# Patient Record
Sex: Male | Born: 1975 | Race: White | Hispanic: No | Marital: Married | Smoking: Current every day smoker
Health system: Southern US, Community
[De-identification: ages and names within clinical notes are randomized; demographics above are authoritative.]

## PROBLEM LIST (undated history)

## (undated) DIAGNOSIS — M797 Fibromyalgia: Secondary | ICD-10-CM

## (undated) DIAGNOSIS — G8929 Other chronic pain: Secondary | ICD-10-CM

## (undated) DIAGNOSIS — M5430 Sciatica, unspecified side: Secondary | ICD-10-CM

## (undated) DIAGNOSIS — M549 Dorsalgia, unspecified: Secondary | ICD-10-CM

## (undated) DIAGNOSIS — M329 Systemic lupus erythematosus, unspecified: Secondary | ICD-10-CM

## (undated) DIAGNOSIS — M069 Rheumatoid arthritis, unspecified: Secondary | ICD-10-CM

## (undated) DIAGNOSIS — IMO0002 Reserved for concepts with insufficient information to code with codable children: Secondary | ICD-10-CM

## (undated) DIAGNOSIS — M533 Sacrococcygeal disorders, not elsewhere classified: Secondary | ICD-10-CM

---

## 2014-01-21 ENCOUNTER — Encounter (HOSPITAL_COMMUNITY): Payer: Self-pay | Admitting: Emergency Medicine

## 2014-01-21 ENCOUNTER — Emergency Department (HOSPITAL_COMMUNITY)
Admission: EM | Admit: 2014-01-21 | Discharge: 2014-01-21 | Disposition: A | Discharge status: Home / Self Care | Payer: Medicare Other | Attending: Emergency Medicine | Admitting: Emergency Medicine

## 2014-01-21 DIAGNOSIS — Z8739 Personal history of other diseases of the musculoskeletal system and connective tissue: Secondary | ICD-10-CM | POA: Insufficient documentation

## 2014-01-21 DIAGNOSIS — M545 Low back pain, unspecified: Secondary | ICD-10-CM

## 2014-01-21 DIAGNOSIS — M329 Systemic lupus erythematosus, unspecified: Secondary | ICD-10-CM | POA: Insufficient documentation

## 2014-01-21 DIAGNOSIS — G8929 Other chronic pain: Secondary | ICD-10-CM

## 2014-01-21 DIAGNOSIS — Z880 Allergy status to penicillin: Secondary | ICD-10-CM | POA: Insufficient documentation

## 2014-01-21 DIAGNOSIS — Y929 Unspecified place or not applicable: Secondary | ICD-10-CM | POA: Insufficient documentation

## 2014-01-21 DIAGNOSIS — R52 Pain, unspecified: Secondary | ICD-10-CM | POA: Insufficient documentation

## 2014-01-21 DIAGNOSIS — Y9389 Activity, other specified: Secondary | ICD-10-CM | POA: Insufficient documentation

## 2014-01-21 DIAGNOSIS — F17200 Nicotine dependence, unspecified, uncomplicated: Secondary | ICD-10-CM | POA: Insufficient documentation

## 2014-01-21 DIAGNOSIS — X503XXA Overexertion from repetitive movements, initial encounter: Secondary | ICD-10-CM | POA: Insufficient documentation

## 2014-01-21 HISTORY — DX: Dorsalgia, unspecified: M54.9

## 2014-01-21 HISTORY — DX: Rheumatoid arthritis, unspecified: M06.9

## 2014-01-21 HISTORY — DX: Systemic lupus erythematosus, unspecified: M32.9

## 2014-01-21 HISTORY — DX: Sciatica, unspecified side: M54.30

## 2014-01-21 HISTORY — DX: Sacrococcygeal disorders, not elsewhere classified: M53.3

## 2014-01-21 HISTORY — DX: Other chronic pain: G89.29

## 2014-01-21 HISTORY — DX: Reserved for concepts with insufficient information to code with codable children: IMO0002

## 2014-01-21 HISTORY — DX: Fibromyalgia: M79.7

## 2014-01-21 MED ORDER — OXYCODONE-ACETAMINOPHEN 5-325 MG PO TABS
1.0000 | ORAL_TABLET | Freq: Once | ORAL | Status: AC
Start: 2014-01-21 — End: 2014-01-21
  Administered 2014-01-21: 1 via ORAL
  Filled 2014-01-21: qty 1

## 2014-01-21 MED ORDER — DIAZEPAM 5 MG PO TABS
5.0000 mg | ORAL_TABLET | Freq: Once | ORAL | Status: AC
  Administered 2014-01-21: 5 mg via ORAL
  Filled 2014-01-21: qty 1

## 2014-01-21 MED ORDER — DIAZEPAM 5 MG PO TABS: 5.0000 mg | ORAL_TABLET | Freq: Four times a day (QID) | ORAL | Status: AC | PRN

## 2014-01-21 MED ORDER — PREDNISONE 20 MG PO TABS: 40.0000 mg | ORAL_TABLET | Freq: Every day | ORAL | Status: AC

## 2014-01-21 MED ORDER — OXYCODONE-ACETAMINOPHEN 5-325 MG PO TABS: ORAL_TABLET | ORAL | Status: AC

## 2014-01-21 MED ORDER — KETOROLAC TROMETHAMINE 60 MG/2ML IM SOLN
30.0000 mg | Freq: Once | INTRAMUSCULAR | Status: AC
  Administered 2014-01-21: 30 mg via INTRAMUSCULAR
  Filled 2014-01-21: qty 2

## 2014-01-21 NOTE — ED Notes (Signed)
Patient states he has been moving, has lifting heavy boxes.  Patient states he does have chronic back pain.  Patient states that he needs a referral for PCP.

## 2014-01-21 NOTE — ED Notes (Signed)
Pt states that he moved here 3 months ago, states he has not seen a PCP. States he also used to see a pain management doctor but has not established one here yet. States he used to take neurontin 300 mg tid, zanifelx 2mg  tid, norco 10mg  4xday. States he has been out of meds x1 month.

## 2014-01-21 NOTE — Discharge Instructions (Signed)
Please take ibuprofen 400mg  (this is normally 2 over the counter pills) every 6 hours (take with food to minimze stomach irritation).   Take valium and/or percocet for breakthrough pain, do not drink alcohol, drive, care for children or perfom other critical tasks while taking valium and/or percocet.  Please follow with your primary care doctor in the next 2 days for a check-up. They must obtain records for further management.   Do not hesitate to return to the Emergency Department for any new, worsening or concerning symptoms.    Chronic Back Pain  When back pain lasts longer than 3 months, it is called chronic back pain.People with chronic back pain often go through certain periods that are more intense (flare-ups).  CAUSES Chronic back pain can be caused by wear and tear (degeneration) on different structures in your back. These structures include:  The bones of your spine (vertebrae) and the joints surrounding your spinal cord and nerve roots (facets).  The strong, fibrous tissues that connect your vertebrae (ligaments). Degeneration of these structures may result in pressure on your nerves. This can lead to constant pain. HOME CARE INSTRUCTIONS  Avoid bending, heavy lifting, prolonged sitting, and activities which make the problem worse.  Take brief periods of rest throughout the day to reduce your pain. Lying down or standing usually is better than sitting while you are resting.  Take over-the-counter or prescription medicines only as directed by your caregiver. SEEK IMMEDIATE MEDICAL CARE IF:   You have weakness or numbness in one of your legs or feet.  You have trouble controlling your bladder or bowels.  You have nausea, vomiting, abdominal pain, shortness of breath, or fainting. Document Released: 10/12/2004 Document Revised: 11/27/2011 Document Reviewed: 08/19/2011 [facility] Patient Information 2014 Webster, Jason.

## 2014-01-21 NOTE — ED Provider Notes (Signed)
CSN: [id number]     Arrival date & time 01/21/14  1911 History  This chart was scribed for non-physician practitioner Wynetta Emery, PA-C working with Merrie Roof, * by Valera Castle, ED scribe. This patient was seen in room TR05C/TR05C and the patient's care was started at 8:23 PM.   Chief Complaint  Patient presents with  . Back Pain   (Consider location/radiation/quality/duration/timing/severity/associated sxs/prior Treatment) The history is provided by the patient. No language interpreter was used.   HPI Comments: Jason Webster is a 38 y.o. male with h/o chronic back pain who presents to the Emergency Department complaining of constant, lower back pain, onset after lifting heavy boxes several days ago. He denies obvious injuries at that time. He reports his back pain intermittently radiates down his bilateral LE, worse on the left. He states this pain is different from his h/lo chronic back pain. He states he lifts his kids up a lot and needs to get his problem fixed so he can take care of his children. He states he has been taking Tylenol for his pain without relief. He denies any other symptoms. He reports allergies to Penicillin and Diclofenac. He reports h/o Lupus. He denies h/o cancer, IV drug use.   PCP - No primary provider on file.  Past Medical History  Diagnosis Date  . Chronic back pain   . Sciatica   . RA (rheumatoid arthritis)   . Tail bone pain     inverted  . Fibromyalgia   . Lupus     in remission   History reviewed. No pertinent past surgical history. History reviewed. No pertinent family history. History  Substance Use Topics  . Smoking status: Current Every Day Smoker  . Smokeless tobacco: Not on file  . Alcohol Use: No    Review of Systems  Constitutional: Negative for fever.  Musculoskeletal: Positive for back pain (lower) and myalgias. Negative for arthralgias and gait problem.  Skin: Negative for wound.   Allergies  Diclofenac;  Other; and Penicillins  Home Medications   Prior to Admission medications   Not on File   BP 128/75  Pulse 113  Temp(Src) 98 F (36.7 C) (Oral)  Resp 16  Ht 6' (1.829 m)  Wt 200 lb (90.719 kg)  BMI 27.12 kg/m2  SpO2 96%  Physical Exam  Nursing note and vitals reviewed. Constitutional: He is oriented to person, place, and time. He appears well-developed and well-nourished. No distress.  HENT:  Head: Normocephalic and atraumatic.  Mouth/Throat: Oropharynx is clear and moist.  Eyes: Conjunctivae and EOM are normal. Pupils are equal, round, and reactive to light.  Neck: Normal range of motion. Neck supple.  Cardiovascular: Normal rate, regular rhythm and intact distal pulses.   Pulmonary/Chest: Effort normal. No stridor. No respiratory distress. He has no wheezes. He has no rales. He exhibits no tenderness.  Abdominal: Soft. Bowel sounds are normal.  Musculoskeletal: Normal range of motion.  No point tenderness to percussion of lumbar spinal processes.  No TTP or paraspinal muscular spasm. Strength is 5 out of 5 to bilateral lower extremities at hip and knee; extensor hallucis longus 5 out of 5. Ankle strength 5 out of 5, no clonus, neurovascularly intact. No saddle anaesthesia. Patellar reflexes are 2+ bilaterally.    Ambulates with antalgic gait   Neurological: He is alert and oriented to person, place, and time.  Skin: Skin is warm and dry.  Psychiatric: He has a normal mood and affect. His behavior is normal.  ED Course  Procedures (including critical care time)  DIAGNOSTIC STUDIES: Oxygen Saturation is 96% on room air, normal by my interpretation.    COORDINATION OF CARE: 8:26 PM-Discussed treatment plan which includes a Toradol injection, Rx for pain medication and muscle relaxant with pt at bedside and pt agreed to plan. Will refer pt to orthopedist.   Labs Review Labs Reviewed - No data to display  Imaging Review No results found.   EKG Interpretation None      Medications  ketorolac (TORADOL) injection 30 mg (30 mg Intramuscular Given 01/21/14 2038)  oxyCODONE-acetaminophen (PERCOCET/ROXICET) 5-325 MG per tablet 1 tablet (1 tablet Oral Given 01/21/14 2039)  diazepam (VALIUM) tablet 5 mg (5 mg Oral Given 01/21/14 2039)   MDM   Final diagnoses:  Acute exacerbation of chronic low back pain    Filed Vitals:   01/21/14 1917 01/21/14 2116  BP: 128/75 117/71  Pulse: 113 66  Temp: 98 F (36.7 C) 97.4 F (36.3 C)  TempSrc: Oral Oral  Resp: 16 18  Height: 6' (1.829 m)   Weight: 200 lb (90.719 kg)   SpO2: 96% 99%    Medications  ketorolac (TORADOL) injection 30 mg (30 mg Intramuscular Given 01/21/14 2038)  oxyCODONE-acetaminophen (PERCOCET/ROXICET) 5-325 MG per tablet 1 tablet (1 tablet Oral Given 01/21/14 2039)  diazepam (VALIUM) tablet 5 mg (5 mg Oral Given 01/21/14 2039)    [name] [place] Duff is a 38 y.o. male presenting with  back pain.  No records in [facility] database. No neurological deficits and normal neuro exam.  Patient can walk but states is painful.  No loss of bowel or bladder control.  No concern for cauda equina.  No fever, night sweats, weight loss, h/o cancer, IVDU.  RICE protocol and pain medicine indicated and discussed with patient.  Evaluation does not show pathology that would require ongoing emergent intervention or inpatient treatment. Pt is hemodynamically stable and mentating appropriately. Discussed findings and plan with patient/guardian, who agrees with care plan. All questions answered. Return precautions discussed and outpatient follow up given.   Discharge Medication List as of 01/21/2014  9:01 PM    START taking these medications   Details  diazepam (VALIUM) 5 MG tablet Take 1 tablet (5 mg total) by mouth every 6 (six) hours as needed for anxiety (spasms)., Starting 01/21/2014, Until Discontinued, Print    oxyCODONE-acetaminophen (PERCOCET/ROXICET) 5-325 MG per tablet 1 to 2 tabs PO q6hrs  PRN for pain, Print     predniSONE (DELTASONE) 20 MG tablet Take 2 tablets (40 mg total) by mouth daily., Starting 01/21/2014, Until Discontinued, Print        Note: Portions of this report may have been transcribed using voice recognition software. Every effort was made to ensure accuracy; however, inadvertent computerized transcription errors may be present   I personally performed the services described in this documentation, which was scribed in my presence. The recorded information has been reviewed and is accurate.    Wynetta Emery, PA-C 01/22/14 [phone]

## 2014-01-21 NOTE — ED Notes (Signed)
Pt states his wife will be driving him home

## 2014-01-23 NOTE — ED Provider Notes (Signed)
Medical screening examination/treatment/procedure(s) were performed by non-physician practitioner and as supervising physician I was immediately available for consultation/collaboration.   Candyce Churn III, MD 01/23/14 [phone]

## 2014-01-27 ENCOUNTER — Encounter (HOSPITAL_COMMUNITY): Payer: Self-pay | Admitting: Emergency Medicine

## 2014-01-27 ENCOUNTER — Emergency Department (HOSPITAL_COMMUNITY)
Admission: EM | Admit: 2014-01-27 | Discharge: 2014-01-27 | Disposition: A | Discharge status: Home / Self Care | Payer: Medicare Other | Attending: Emergency Medicine | Admitting: Emergency Medicine

## 2014-01-27 DIAGNOSIS — IMO0001 Reserved for inherently not codable concepts without codable children: Secondary | ICD-10-CM | POA: Insufficient documentation

## 2014-01-27 DIAGNOSIS — M329 Systemic lupus erythematosus, unspecified: Secondary | ICD-10-CM | POA: Insufficient documentation

## 2014-01-27 DIAGNOSIS — IMO0002 Reserved for concepts with insufficient information to code with codable children: Secondary | ICD-10-CM | POA: Insufficient documentation

## 2014-01-27 DIAGNOSIS — M5430 Sciatica, unspecified side: Secondary | ICD-10-CM | POA: Insufficient documentation

## 2014-01-27 DIAGNOSIS — F17200 Nicotine dependence, unspecified, uncomplicated: Secondary | ICD-10-CM | POA: Insufficient documentation

## 2014-01-27 DIAGNOSIS — M545 Low back pain, unspecified: Secondary | ICD-10-CM | POA: Insufficient documentation

## 2014-01-27 DIAGNOSIS — G8929 Other chronic pain: Secondary | ICD-10-CM | POA: Insufficient documentation

## 2014-01-27 DIAGNOSIS — Z79899 Other long term (current) drug therapy: Secondary | ICD-10-CM | POA: Insufficient documentation

## 2014-01-27 DIAGNOSIS — M549 Dorsalgia, unspecified: Secondary | ICD-10-CM

## 2014-01-27 DIAGNOSIS — Z880 Allergy status to penicillin: Secondary | ICD-10-CM | POA: Insufficient documentation

## 2014-01-27 DIAGNOSIS — M069 Rheumatoid arthritis, unspecified: Secondary | ICD-10-CM | POA: Insufficient documentation

## 2014-01-27 MED ORDER — OXYCODONE-ACETAMINOPHEN 5-325 MG PO TABS: 1.0000 | ORAL_TABLET | ORAL | Status: AC | PRN

## 2014-01-27 NOTE — ED Provider Notes (Signed)
CSN: [id number]     Arrival date & time 01/27/14  1053 History  This chart was scribed for non-physician practitioner working with Shanna Cisco, MD, by Jarvis Morgan, ED Scribe. This patient was seen in room TR07C/TR07C and the patient's care was started at 11:24 AM.    Chief Complaint  Patient presents with  . Back Pain    Patient is a 38 y.o. male presenting with back pain. The history is provided by the patient. No language interpreter was used.  Back Pain Location:  Lumbar spine Quality:  Shooting Radiates to: both legs. Pain severity:  Moderate Pain is:  Same all the time Duration:  1 day Timing:  Constant Progression:  Worsening Chronicity:  Chronic Relieved by:  Narcotics (Percocet) Worsened by:  Movement Risk factors: no hx of cancer     HPI Comments: Trygg Mantz is a 38 y.o. male with a h/o of chronic back pain who presents to the Emergency Department complaining of gradually worsening, constant, moderate, lower back pain that radiates down both legs. Patient states that the pain started last week but it has gotten worse in the past day. Patient was in the ER on 01/21/14 for the same symptoms and states that his pain started when he was lifting heavy boxes. Patient denies any recent injuries or falls to make the pain worse. Patient states that the pain is exacerbated by activity. Patient states that this pain is causing him to have difficulty sleeping, driving and picking up his kids. Patient states that he took a Percocet which provided relief. Patient states that he was seen by a pain management doctor in [place] but since moving to [place] he does not have a doctor he sees for his pain management. Pt states that he has an appt with Family Medicine/Pain Management on 02/10/14. Patient has no history of cancer or IV drug use.    Past Medical History  Diagnosis Date  . Chronic back pain   . Sciatica   . RA (rheumatoid arthritis)   . Tail bone pain      inverted  . Fibromyalgia   . Lupus     in remission   History reviewed. No pertinent past surgical history. History reviewed. No pertinent family history. History  Substance Use Topics  . Smoking status: Current Every Day Smoker  . Smokeless tobacco: Not on file  . Alcohol Use: No    Review of Systems  Musculoskeletal: Positive for back pain (radiates to bilateral legs).  All other systems reviewed and are negative.     Allergies  Diclofenac; Other; and Penicillins  Home Medications   Prior to Admission medications   Medication Sig Start Date End Date Taking? Authorizing Provider  acetaminophen (TYLENOL) 500 MG tablet Take 500 mg by mouth every 6 (six) hours as needed for mild pain.    Historical Provider, MD  diazepam (VALIUM) 5 MG tablet Take 1 tablet (5 mg total) by mouth every 6 (six) hours as needed for anxiety (spasms). 01/21/14   Nicole Pisciotta, PA-C  oxyCODONE-acetaminophen (PERCOCET/ROXICET) 5-325 MG per tablet 1 to 2 tabs PO q6hrs  PRN for pain 01/21/14   Joni Reining Pisciotta, PA-C  predniSONE (DELTASONE) 20 MG tablet Take 2 tablets (40 mg total) by mouth daily. 01/21/14   Nicole Pisciotta, PA-C   Triage Vitals: BP 118/63  Pulse 89  Temp(Src) 98.3 F (36.8 C) (Oral)  Resp 18  Ht 6' (1.829 m)  Wt 200 lb (90.719 kg)  BMI 27.12 kg/m2  SpO2 96%  Physical Exam  Nursing note and vitals reviewed. Constitutional: He is oriented to person, place, and time. He appears well-developed and well-nourished. No distress.  HENT:  Head: Normocephalic and atraumatic.  Right Ear: External ear normal.  Left Ear: External ear normal.  Nose: Nose normal.  Mouth/Throat: Oropharynx is clear and moist.  Eyes: Conjunctivae are normal.  Neck: Normal range of motion. Neck supple.  Cardiovascular: Normal rate.   Pulmonary/Chest: Effort normal.  Abdominal: Soft.  Musculoskeletal: Normal range of motion.  No midline spine tenderness to palpation  Neurological: He is alert and oriented to  person, place, and time.  Skin: Skin is warm and dry. He is not diaphoretic.  Psychiatric: He has a normal mood and affect.    ED Course  Procedures (including critical care time)  DIAGNOSTIC STUDIES: Oxygen Saturation is 96% on RA, adequate by my interpretation.    COORDINATION OF CARE:    Labs Review Labs Reviewed - No data to display  Imaging Review No results found.   EKG Interpretation None      MDM   Final diagnoses:  Chronic back pain    11:40 AM Patient not having any new symptoms. No bladder/bowel incontinence or saddle paresthesias. Vitals stable and patient afebrile. Patient states he has an appointment in 2 weeks with pain management/family medicine. Patient will have a short course of Percocet on discharge.   I personally performed the services described in this documentation, which was scribed in my presence. The recorded information has been reviewed and is accurate.     Emilia Beck, PA-C 01/27/14 1141

## 2014-01-27 NOTE — ED Provider Notes (Signed)
Medical screening examination/treatment/procedure(s) were performed by non-physician practitioner and as supervising physician I was immediately available for consultation/collaboration.    Shanna Cisco, MD 01/27/14 2051

## 2014-01-27 NOTE — ED Notes (Signed)
Pt reports lower back pain, radiates down both legs. Pt ambulatory at triage, was seen here last week for same but reports increase in pain.

## 2014-02-09 ENCOUNTER — Emergency Department (HOSPITAL_COMMUNITY): Payer: Medicare Other

## 2014-02-09 ENCOUNTER — Encounter (HOSPITAL_COMMUNITY): Payer: Self-pay | Admitting: Emergency Medicine

## 2014-02-09 ENCOUNTER — Emergency Department (HOSPITAL_COMMUNITY)
Admission: EM | Admit: 2014-02-09 | Discharge: 2014-02-09 | Disposition: A | Discharge status: Home / Self Care | Payer: Medicare Other | Attending: Emergency Medicine | Admitting: Emergency Medicine

## 2014-02-09 DIAGNOSIS — F17200 Nicotine dependence, unspecified, uncomplicated: Secondary | ICD-10-CM | POA: Insufficient documentation

## 2014-02-09 DIAGNOSIS — W01119A Fall on same level from slipping, tripping and stumbling with subsequent striking against unspecified sharp object, initial encounter: Secondary | ICD-10-CM | POA: Insufficient documentation

## 2014-02-09 DIAGNOSIS — Z79899 Other long term (current) drug therapy: Secondary | ICD-10-CM | POA: Insufficient documentation

## 2014-02-09 DIAGNOSIS — M329 Systemic lupus erythematosus, unspecified: Secondary | ICD-10-CM | POA: Insufficient documentation

## 2014-02-09 DIAGNOSIS — Y9389 Activity, other specified: Secondary | ICD-10-CM | POA: Insufficient documentation

## 2014-02-09 DIAGNOSIS — Y929 Unspecified place or not applicable: Secondary | ICD-10-CM | POA: Insufficient documentation

## 2014-02-09 DIAGNOSIS — M545 Low back pain: Secondary | ICD-10-CM

## 2014-02-09 DIAGNOSIS — M069 Rheumatoid arthritis, unspecified: Secondary | ICD-10-CM | POA: Insufficient documentation

## 2014-02-09 DIAGNOSIS — M5430 Sciatica, unspecified side: Secondary | ICD-10-CM | POA: Insufficient documentation

## 2014-02-09 DIAGNOSIS — G8929 Other chronic pain: Secondary | ICD-10-CM | POA: Insufficient documentation

## 2014-02-09 DIAGNOSIS — IMO0002 Reserved for concepts with insufficient information to code with codable children: Secondary | ICD-10-CM | POA: Insufficient documentation

## 2014-02-09 DIAGNOSIS — W19XXXA Unspecified fall, initial encounter: Secondary | ICD-10-CM

## 2014-02-09 DIAGNOSIS — Z880 Allergy status to penicillin: Secondary | ICD-10-CM | POA: Insufficient documentation

## 2014-02-09 LAB — URINALYSIS, ROUTINE W REFLEX MICROSCOPIC
GLUCOSE, UA: NEGATIVE mg/dL
Hgb urine dipstick: NEGATIVE
Ketones, ur: NEGATIVE mg/dL
LEUKOCYTES UA: NEGATIVE
Nitrite: NEGATIVE
Protein, ur: NEGATIVE mg/dL
SPECIFIC GRAVITY, URINE: 1.038 — AB (ref 1.005–1.030)
UROBILINOGEN UA: 1 mg/dL (ref 0.0–1.0)
pH: 6.5 (ref 5.0–8.0)

## 2014-02-09 MED ORDER — OXYCODONE-ACETAMINOPHEN 5-325 MG PO TABS: 1.0000 | ORAL_TABLET | Freq: Three times a day (TID) | ORAL | Status: AC | PRN

## 2014-02-09 MED ORDER — PREDNISONE 20 MG PO TABS: ORAL_TABLET | ORAL | Status: AC

## 2014-02-09 NOTE — ED Provider Notes (Signed)
CSN: [id number]     Arrival date & time 02/09/14  1434 History  This chart was scribed for non-physician practitioner, Raymon Mutton, PA-C,working with Richardean Canal, MD, by Karle Plumber, ED Scribe.  This patient was seen in room WTR8/WTR8 and the patient's care was started at 3:48 PM.  Chief Complaint  Patient presents with  . Back Pain  . Fall   The history is provided by the patient. No language interpreter was used.   HPI Comments:  Jason Webster is a 38 y.o. male with h/o chronic back pain, who presents to the Emergency Department complaining of a fall on to his left side from a 4 foot step ladder approximately 4 hours ago. Pt states he is having left-sided sharp, shooting lower lumbar back pain that does not radiate. He reports associated tingling of the left thigh. He states sitting on it and adding pressure makes the pain worse. He states he took Tylenol for the pain with no relief. He denies fever, LOC, CP, SOB, difficulty breathing, numbness, abdominal pain, nausea, vomiting, neck pain, bowel or bladder incontinence. Pt states he does not have a PCP and does not have the money for copays for one yet.   Past Medical History  Diagnosis Date  . Chronic back pain   . Sciatica   . RA (rheumatoid arthritis)   . Tail bone pain     inverted  . Fibromyalgia   . Lupus     in remission   History reviewed. No pertinent past surgical history. No family history on file. History  Substance Use Topics  . Smoking status: Current Every Day Smoker  . Smokeless tobacco: Not on file  . Alcohol Use: No    Review of Systems  Constitutional: Negative for fever.  Respiratory: Negative for shortness of breath.   Cardiovascular: Negative for chest pain.  Gastrointestinal: Negative for nausea, vomiting and abdominal pain.  Musculoskeletal: Positive for back pain. Negative for neck pain.  Neurological: Negative for syncope and numbness.  All other systems reviewed and are  negative.   Allergies  Diclofenac; Other; and Penicillins  Home Medications   Prior to Admission medications   Medication Sig Start Date End Date Taking? Authorizing Provider  acetaminophen (TYLENOL) 500 MG tablet Take 500 mg by mouth every 6 (six) hours as needed for mild pain.    Historical Provider, MD  diazepam (VALIUM) 5 MG tablet Take 1 tablet (5 mg total) by mouth every 6 (six) hours as needed for anxiety (spasms). 01/21/14   Nicole Pisciotta, PA-C  oxyCODONE-acetaminophen (PERCOCET/ROXICET) 5-325 MG per tablet 1 to 2 tabs PO q6hrs  PRN for pain 01/21/14   Joni Reining Pisciotta, PA-C  oxyCODONE-acetaminophen (PERCOCET/ROXICET) 5-325 MG per tablet Take 1-2 tablets by mouth every 4 (four) hours as needed for severe pain. 01/27/14   Kaitlyn Szekalski, PA-C  predniSONE (DELTASONE) 20 MG tablet Take 2 tablets (40 mg total) by mouth daily. 01/21/14   Nicole Pisciotta, PA-C   Triage Vitals: BP 121/71  Pulse 99  Temp(Src) 98.5 F (36.9 C) (Oral)  Resp 20  Ht 6' (1.829 m)  Wt 200 lb (90.719 kg)  BMI 27.12 kg/m2  SpO2 98% Physical Exam  Nursing note and vitals reviewed. Constitutional: He is oriented to person, place, and time. He appears well-developed and well-nourished.  HENT:  Head: Normocephalic and atraumatic.  Mouth/Throat: Oropharynx is clear and moist. No oropharyngeal exudate.  Eyes: Conjunctivae and EOM are normal. Pupils are equal, round, and reactive to light. Right  eye exhibits no discharge. Left eye exhibits no discharge.  Neck: Normal range of motion. Neck supple. No tracheal deviation present.  Negative neck stiffness Negative nuchal rigidity Negative cervical lymphadenopathy  Negative pain upon palpation to the c-spine  Cardiovascular: Normal rate, regular rhythm and normal heart sounds.  Exam reveals no friction rub.   No murmur heard. Pulses:      Radial pulses are 2+ on the right side, and 2+ on the left side.       Dorsalis pedis pulses are 2+ on the right side, and 2+  on the left side.  Pulmonary/Chest: Effort normal and breath sounds normal. No respiratory distress. He has no wheezes. He has no rales. He exhibits tenderness.    Patient is able to speak in full sentences without difficulty Negative use of accessory muscles Negative stridor Discomfort upon palpation to the left side of the ribs - wincing upon palpation - negative crepitus noted  Abdominal: Soft. Bowel sounds are normal. He exhibits no distension. There is no tenderness. There is no rebound and no guarding.  Negative ecchymosis Negative abdominal distention Bowel sounds normoactive in all 4 quadrants Negative pain upon palpation to the abdomen Abdomen soft Negative guarding or rigidity noted. Negative peritoneal signs.  Musculoskeletal: Normal range of motion. He exhibits tenderness.       Lumbar back: He exhibits tenderness and bony tenderness. He exhibits normal range of motion, no swelling, no edema, no deformity, no laceration and no pain.       Back:  Negative swelling, erythema, inflammation, lesions, sores, deformities noted to the lumbosacral spine. Discomfort noted upon palpation to the mid-lumbosacral spine and left paraspinal region.  Full ROM to upper and lower extremities without difficulty noted, negative ataxia noted.  Lymphadenopathy:    He has no cervical adenopathy.  Neurological: He is alert and oriented to person, place, and time. No cranial nerve deficit. He exhibits normal muscle tone. Coordination normal.  Cranial nerves III-XII grossly intact Strength 5+/5+ to upper and lower extremities bilaterally with resistance applied, equal distribution noted Equal grip strength Sensation intact with differentiation to sharp and dull touch Negative arm drift Negative bilateral paresthesias Negative slurred speech Negative facial drooping Negative aphasia Heel to knee down shin normal bilaterally Gait proper, proper balance - negative sway, negative drift, negative  step-offs  Skin: Skin is warm and dry.  Psychiatric: He has a normal mood and affect. His behavior is normal.    ED Course  Procedures (including critical care time) DIAGNOSTIC STUDIES: Oxygen Saturation is 98% on RA, normal by my interpretation.   COORDINATION OF CARE: 4:02 PM- Will X-Ray left ribs due tenderness. Pt verbalizes understanding and agrees to plan.  Medications - No data to display  Labs Review Labs Reviewed  URINALYSIS, ROUTINE W REFLEX MICROSCOPIC - Abnormal; Notable for the following:    Color, Urine AMBER (*)    Specific Gravity, Urine 1.038 (*)    Bilirubin Urine SMALL (*)    All other components within normal limits    Imaging Review Dg Ribs Unilateral W/chest Left  02/09/2014   CLINICAL DATA:  Fall, left rib pain  EXAM: LEFT RIBS AND CHEST - 3+ VIEW  COMPARISON:  None.  FINDINGS: Three views left ribs submitted. No acute infiltrate or pulmonary edema. No left rib fracture. No pneumothorax.  IMPRESSION: Negative.   Electronically Signed   By: Natasha Mead M.D.   On: 02/09/2014 16:26   Dg Cervical Spine Complete  02/09/2014   CLINICAL DATA:  Pain post fall  EXAM: CERVICAL SPINE  4+ VIEWS  COMPARISON:  None.  FINDINGS: There is no evidence of cervical spine fracture or prevertebral soft tissue swelling. Alignment is normal. No other significant bone abnormalities are identified.  IMPRESSION: Negative cervical spine radiographs.   Electronically Signed   By: Oley Balm M.D.   On: 02/09/2014 15:20   Dg Thoracic Spine 2 View  02/09/2014   CLINICAL DATA:  Spine pain secondary to a fall from a ladder today.  EXAM: THORACIC SPINE - 2 VIEW  COMPARISON:  None.  FINDINGS: There is no evidence of thoracic spine fracture. Alignment is normal. No other significant bone abnormalities are identified.  IMPRESSION: Normal.   Electronically Signed   By: Geanie Cooley M.D.   On: 02/09/2014 15:20   Dg Lumbar Spine Complete  02/09/2014   CLINICAL DATA:  38 year old male with fall  and low back pain.  EXAM: LUMBAR SPINE - COMPLETE 4+ VIEW  COMPARISON:  None.  FINDINGS: Five non rib-bearing lumbar type vertebra identified.  1 cm anterolisthesis of L5 on S1 is noted with bilateral L5 pars defects.  Moderate to severe degenerative disc disease at L5-S1 noted.  There is no evidence of acute fracture.  IMPRESSION: Grade 1 anterolisthesis of L5 on S1 with bilateral L5 pars defects.  No evidence of acute fracture.   Electronically Signed   By: Laveda Abbe M.D.   On: 02/09/2014 15:22   Dg Sacrum/coccyx  02/09/2014   CLINICAL DATA:  Pain post fall  EXAM: SACRUM AND COCCYX - 2+ VIEW  COMPARISON:  None.  FINDINGS: 10 mm anterolisthesis L5-S1. Mild narrowing of the L4-5 interspace, moderate narrowing L5-S1 with vacuum phenomenon. L5 pars defects evident. Sacrum intact.  IMPRESSION: 1. Degenerative disc disease L5-S1 with anterolisthesis secondary to L5 pars defects. 2. No acute fracture.   Electronically Signed   By: Oley Balm M.D.   On: 02/09/2014 15:22   Dg Hip Bilateral W/pelvis  02/09/2014   CLINICAL DATA:  Pain post fall  EXAM: BILATERAL HIP WITH PELVIS - 4+ VIEW  COMPARISON:  None.  FINDINGS: Negative for fracture or dislocation. Normal mineralization and alignment. No significant osseous degenerative change.  IMPRESSION: Negative   Electronically Signed   By: Oley Balm M.D.   On: 02/09/2014 15:23     EKG Interpretation None      MDM   Final diagnoses:  Fall  Acute exacerbation of chronic low back pain    Filed Vitals:   02/09/14 1448  BP: 121/71  Pulse: 99  Temp: 98.5 F (36.9 C)  TempSrc: Oral  Resp: 20  Height: 6' (1.829 m)  Weight: 200 lb (90.719 kg)  SpO2: 98%    I personally performed the services described in this documentation, which was scribed in my presence. The recorded information has been reviewed and is accurate.  Urinalysis negative for nitrites or leukocytes-negative findings of urine infection-negative blood noted in urine. Plain film of  left ribs negative for fracture-negative findings for pneumothorax. Plain film of lumbar spine noted grade one antral lithiasis of L5 on S1 with bilateral L5 pars defects - this is a chronic finding-no acute fracture noted. Plain film thoracic spine negative for acute osseous injury. Plain film cervical spine negative for acute osseous injury. Plain film coccyx and sacrum noted degenerative disc disease at L5-S1 with antral lithiasis secondary to L5 pars defects with no acute fracture noted. Plain film bilateral hip with pelvis negative for acute osseous injury. Negative acute  trauma identified to the abdomen-negative ecchymosis, bowel sounds normoactive in all 4 quadrants with negative peritoneal signs rigidity upon palpation-nonsurgical abdomen noted. Negative focal neurological deficits noted. Sensation intact. Pulses palpable and strong. Gait noted mild limp - negative ataxia gait. Patient stable, afebrile. Patient not septic appearing. Doubt cauda equina syndrome. Suspicion to be acute exacerbation of chronic lower back pain secondary to mechanism of fall. Discharged patient. Discharge patient with pain medications and muscle relaxers. Referred to orthopedics, health and wellness, neurosurgery. Discussed with patient to apply warm compressions and massage. Discussed with patient proper stretching mechanisms. Discussed with patient to closely monitor symptoms and if symptoms are to worsen or change to report back to the ED - strict return instructions given.  Patient agreed to plan of care, understood, all questions answered.   Raymon Mutton, PA-C 02/09/14 2230

## 2014-02-09 NOTE — ED Notes (Signed)
Pt ambulated without issue to radiology

## 2014-02-09 NOTE — ED Notes (Signed)
Pt A+Ox4, reports standing on step ladder while painting, approx 55ft in the air, slipped and fell to ground, landing on L low back/hip area.  Pt denies hitting head or LOC.  C/O 9/10 pain to area, reports worse with movement/standing.  Able to ambulate.  Pt reports tingling to extremity "but that's no worse than usual", pt reports hx sciatica.  Pt denies bowel/bladder changes.  Speaking full/clear sentences.  NAD.

## 2014-02-09 NOTE — Discharge Instructions (Signed)
Please call and set up an appointment with neurosurgery, orthopedics, health and wellness Center Please rest and stay hydrated Please avoid any physical or strenuous activity Please take medications as prescribed while on pain medications is to be no drinking alcohol, driving, operating any heavy machinery if there is extra please disposer proper manner. Please take any extra Tylenol for this can lead to Tylenol overdose and liver issues. Please apply icy hot ointment and massage Place performed back exercises to aid in muscle strengthening Please continue to monitor symptoms closely and if symptoms are to worsen or change (fever greater than 101, chills, chest pain, shortness of breath, difficulty breathing, numbness, tingling, inability to control urine or bowel movements, fall, injury, loss of sensation to the legs, weakness) please report back to the ED immediately   Back Exercises Back exercises help treat and prevent back injuries. The goal is to increase your strength in your belly (abdominal) and back muscles. These exercises can also help with flexibility. Start these exercises when told by your doctor. HOME CARE Back exercises include: Pelvic Tilt.  Lie on your back with your knees bent. Tilt your pelvis until the lower part of your back is against the floor. Hold this position 5 to 10 sec. Repeat this exercise 5 to 10 times. Knee to Chest.  Pull 1 knee up against your chest and hold for 20 to 30 seconds. Repeat this with the other knee. This may be done with the other leg straight or bent, whichever feels better. Then, pull both knees up against your chest. Sit-Ups or Curl-Ups.  Bend your knees 90 degrees. Start with tilting your pelvis, and do a partial, slow sit-up. Only lift your upper half 30 to 45 degrees off the floor. Take at least 2 to 3 seonds for each sit-up. Do not do sit-ups with your knees out straight. If partial sit-ups are difficult, simply do the above but with only  tightening your belly (abdominal) muscles and holding it as told. Hip-Lift.  Lie on your back with your knees flexed 90 degrees. Push down with your feet and shoulders as you raise your hips 2 inches off the floor. Hold for 10 seconds, repeat 5 to 10 times. Back Arches.  Lie on your stomach. Prop yourself up on bent elbows. Slowly press on your hands, causing an arch in your low back. Repeat 3 to 5 times. Shoulder-Lifts.  Lie face down with arms beside your body. Keep hips and belly pressed to floor as you slowly lift your head and shoulders off the floor. Do not overdo your exercises. Be careful in the beginning. Exercises may cause you some mild back discomfort. If the pain lasts for more than 15 minutes, stop the exercises until you see your doctor. Improvement with exercise for back problems is slow.  Document Released: 10/07/2010 Document Revised: 11/27/2011 Document Reviewed: 07/06/2011 [facility] Patient Information 2014 [place], [place]. Back Pain, Adult Back pain is very common. The pain often gets better over time. The cause of back pain is usually not dangerous. Most people can learn to manage their back pain on their own.  HOME CARE   Stay active. Start with short walks on flat ground if you can. Try to walk farther each day.  Do not sit, drive, or stand in one place for more than 30 minutes. Do not stay in bed.  Do not avoid exercise or work. Activity can help your back heal faster.  Be careful when you bend or lift an object. [employer]  at your knees, keep the object close to you, and do not twist.  Sleep on a firm mattress. Lie on your side, and bend your knees. If you lie on your back, put a pillow under your knees.  Only take medicines as told by your doctor.  Put ice on the injured area.  Put ice in a plastic bag.  Place a towel between your skin and the bag.  Leave the ice on for 15-20 minutes, 03-04 times a day for the first 2 to 3 days. After that, you can switch  between ice and heat packs.  Ask your doctor about back exercises or massage.  Avoid feeling anxious or stressed. Find good ways to deal with stress, such as exercise. GET HELP RIGHT AWAY IF:   Your pain does not go away with rest or medicine.  Your pain does not go away in 1 week.  You have new problems.  You do not feel well.  The pain spreads into your legs.  You cannot control when you poop (bowel movement) or pee (urinate).  Your arms or legs feel weak or lose feeling (numbness).  You feel sick to your stomach (nauseous) or throw up (vomit).  You have belly (abdominal) pain.  You feel like you may pass out (faint). MAKE SURE YOU:   Understand these instructions.  Will watch your condition.  Will get help right away if you are not doing well or get worse. Document Released: 02/21/2008 Document Revised: 11/27/2011 Document Reviewed: 01/23/2011 [facility] Patient Information 2014 [place], [place].   Emergency Department Resource Guide 1) Find a Doctor and Pay Out of Pocket Although you won't have to find out who is covered by your insurance plan, it is a good idea to ask around and get recommendations. You will then need to call the office and see if the doctor you have chosen will accept you as a new patient and what types of options they offer for patients who are self-pay. Some doctors offer discounts or will set up payment plans for their patients who do not have insurance, but you will need to ask so you aren't surprised when you get to your appointment.  2) Contact Your Local Health Department Not all health departments have doctors that can see patients for sick visits, but many do, so it is worth a call to see if yours does. If you don't know where your local health department is, you can check in your phone book. The CDC also has a tool to help you locate your state's health department, and many state websites also have listings of all of their local health  departments.  3) Find a Walk-in Clinic If your illness is not likely to be very severe or complicated, you may want to try a walk in clinic. These are popping up all over the country in pharmacies, drugstores, and shopping centers. They're usually staffed by nurse practitioners or physician assistants that have been trained to treat common illnesses and complaints. They're usually fairly quick and inexpensive. However, if you have serious medical issues or chronic medical problems, these are probably not your best option.  No Primary Care Doctor: - Call Health Connect at  [phone] - they can help you locate a primary care doctor that  accepts your insurance, provides certain services, etc. - Physician Referral Service- [phone]  Chronic Pain Problems: Organization         Address  Phone   Notes  [facility]  [phone] Patients need to be referred by their primary care doctor.   Medication Assistance: Organization         Address  Phone   Notes  [facility] Medication [facility] [address] [place] [place], [place] [address] [phone] --Must be a resident of [facility] -- Must have NO insurance coverage whatsoever (no Medicaid/ Medicare, etc.) -- The pt. MUST have a primary care doctor that directs their care regularly and follows them in the community   Med[facility]ist  [phone]   [employer]  [phone]    Agencies that provide inexpensive medical care: Organization         Address  Phone   Notes  [facility]  [phone]   [facility]    [phone]   [facility] [address] [place], [place] [address] [phone]   [facility] [address] [place] [place] [phone]   Planned Parenthood    [phone]   [facility]    [phone]   [facility]  [address], [place] Phone:  [phone], Fax:  [phone] Hours of Operation:  9 am - 6 pm, M-F.  Also accepts Medicaid/Medicare and self-pay.  [facility]  [address], [place] Phone: [phone], Fax: [phone]. Hours of Operation:  8:30 am - 5:30 pm, M-F.  Also accepts Medicaid and self-pay.  [facility] [place] [address], [place] Phone: [phone]   [facility] [address] Natasha Bence [place], [place] [phone], Ext. [phone] Mondays & Thursdays: 7-9 AM.  First 15 patients are seen on a first come, first serve basis.    Medicaid-accepting [facility] Providers:  Organization         Address  Phone   Notes  [facility] [address], [place] [phone] Also accepts self-pay patients.  [facility] [address] Laurell Josephs [place], [place]  [phone]   [facility] [address], [place] [phone]   [facility] [address] [place] [phone]   Renaye Rakers [address], [place]   [phone] Only accepts [place] patients after they have their name applied to their card.   Self-Pay (no insurance) in [facility]:  Organization         Address  Phone   Notes  Sickle Cell Patients, [facility] [address] [place], [place] [phone]   [facility] [address] [place], [place] [phone]   [facility]  [address], [place] [phone]   [facility]/Dr. Osei-Bonsu  [address] [place] or [address], [place] [phone] Phone number for both [place] and [place] locations is the same.  [facility] [address] [place] [phone]   [facility] [address] [place] or [address] [phone]   [facility] [address], [place] [phone], phone; [phone], fax Sees patients 1st and 3rd Saturday of every month.  Must not qualify for public or private insurance (i.e. Medicaid, Medicare, [place] Health Choice, Veterans' Benefits)  Household income should be no more than 200% of the poverty level The clinic cannot treat you if you are pregnant or think you are pregnant  Sexually transmitted diseases are not treated at the clinic.    Dental Care: Organization         Address  Phone  Notes  [facility] [address], [place] [phone] Accepts children up to age 36 who are enrolled in [place]icaid or [place] Health Choice; pregnant women with a Medicaid card; and children who have applied for Medicaid or [place] Health Choice, but were declined, whose parents can pay a reduced fee at time of service.  [facility] [place]  [address], [place] [phone] Accepts children up to age 36 who are enrolled in [place] or [place] Health Choice; pregnant women with a Medicaid card; and children who have applied for Medicaid or [place] Health Choice, but were declined, whose parents can pay a reduced fee at time of service.  [facility]  [address], [place] [phone] Patients are seen by appointment only. Walk-ins are not accepted. [facility] will see patients 36 years of age and older. Monday - Tuesday (8am-5pm) Most Wednesdays (8:30-5pm) $30 per visit, cash only  [facility]  [address], [facility] [phone] Patients are seen by appointment only. Walk-ins are not accepted. [facility] will see patients 36 years of age and older. One Wednesday Evening (Monthly: Volunteer Based).  $30 per visit, cash only  [facility]  [phone] for adults;  Children under age 584, call Graduate Pediatric Dentistry at [phone]. Children aged 704-14, please call [phone] to request a pediatric application.  Dental services are provided in all areas of dental care including fillings, crowns and bridges, complete and partial dentures, implants, gum treatment, root canals, and extractions. Preventive care is also provided. Treatment is provided to both adults and children. Patients are selected via a lottery and there is often a waiting list.   [facility] [address], [place]  [phone] [email or web address]   [facility] [address], [place], [place] [phone], Ext. [phone] Second and Fourth Thursday of each month, opens at 6:30 AM; Clinic ends at 9 AM.  Patients are seen on a first-come first-served basis, and a limited number are seen during each clinic.   [facility]  [address], [place], [place] [phone]   Eligibility Requirements You must have lived in [place], [place], or [place] counties for at least the last three months.   You cannot be eligible for state or federal sponsored [place] Cityhealthcare insurance, including CIGNAVeterans Administration, [place]icaid, or [employer]icare.   You generally cannot be eligible for healthcare insurance through your employer.    How to apply: Eligibility screenings are held every Tuesday and Wednesday afternoon from 1:00 pm until 4:00 pm. You do not need an appointment for the interview!  [facility] [address], [place], [place] [phone]   [facility]  [phone]   [facility]  [phone]   [facility]  [phone]    Behavioral Health Resources in the Community: Intensive Outpatient Programs Organization         Address  Phone  Notes  [facility] [address], [place], [place]  [phone]   [facility] [address] [place], [place] [phone]   [facility] [address] [place], [place]  [phone]   [facility] [address]  [place], [place] [phone] or [phone]   Substance Abuse Resources Organization         Address  Phone  Notes  [facility] Drug Services  [phone]   [facility]  [phone]   [facility] [place]  [phone]   Floydene Flock  [phone]   Residential & Outpatient Substance Abuse Program  [phone]   Psychological Services Organization         Address  Phone  Notes  [facility]  [phone]   [facility] Services  [phone]   [facility] Mental Health [address] [place] [phone] or [phone]    Mobile Crisis Teams Organization         Address  Phone  Notes  [facility], Mobile Crisis Care Unit  [phone]   [facility]  [address] [place], [place] [phone]   Doristine Locks [address] [place] [phone]    Self-Help/Support Groups Organization         Address  Phone             Notes  [facility] [place] - variety of support groups  [phone]- [phone] Call for more information  Narcotics Anonymous (NA), [facility] [address], [employer] [place]  2 meetings at this location   Statistician         Address  Phone  Notes  [facility] [address],    [place]  [phone]   [facility]  [address] [place] [phone], [place], [place] [phone]   [facility] [address] [place], [place] [phone] Admissions: 8am-3pm M-F  [facility] [address] [place] [address]    [place], [place] [phone]   [facility] [address] [place], [place], [place] [phone]   [facility] [address]  [place], [place] [phone]   [facility] - Intensive  Outpatient [address]ure[address], [place], [place] [phone]   [facility] ([facility] [address]  [place], [place] [phone] or [phone]   [facility] ([facility]) [address] [place], [place] [phone] Accepts Medicaid  [facility] [address],  [place] [phone] Substance Abuse/Addiction Treatment   [place] Organization         Address  Phone  Notes  [facility]  [phone]   Angie Fava, PhD [address] Ervin Knack [place], [place]   [phone] or [phone]   [facility]al   [address] [place], [place] [phone]   [facility] [address] [place], [place] [phone] Insurance/Medicaid/sponsorship through [facility] and Families [address]                                    [place], [place] [phone] Therapy/tele-psych/case  [facility] [address], [place] [phone]    Dr. Lolly Mustache  [phone]   [facility]  [employer] [facility] Dept. 1) [address] [place] 2) [address], [place] 3)  [address], [place] [phone]  [phone]   [facility] Child Abuse Hotline [phone] or [phone] (After Hours)

## 2014-02-11 NOTE — ED Provider Notes (Signed)
Medical screening examination/treatment/procedure(s) were performed by non-physician practitioner and as supervising physician I was immediately available for consultation/collaboration.   EKG Interpretation None        Richardean Canal, MD 02/11/14 [phone]

## 2015-02-23 IMAGING — CR DG LUMBAR SPINE COMPLETE 4+V
5 series · 5 of 5 positions shown · non-contrast
Comparison: None.

CLINICAL DATA: 37-year-old male with fall and low back pain.

EXAM:
LUMBAR SPINE - COMPLETE 4+ VIEW

[t lumbar spine ap]
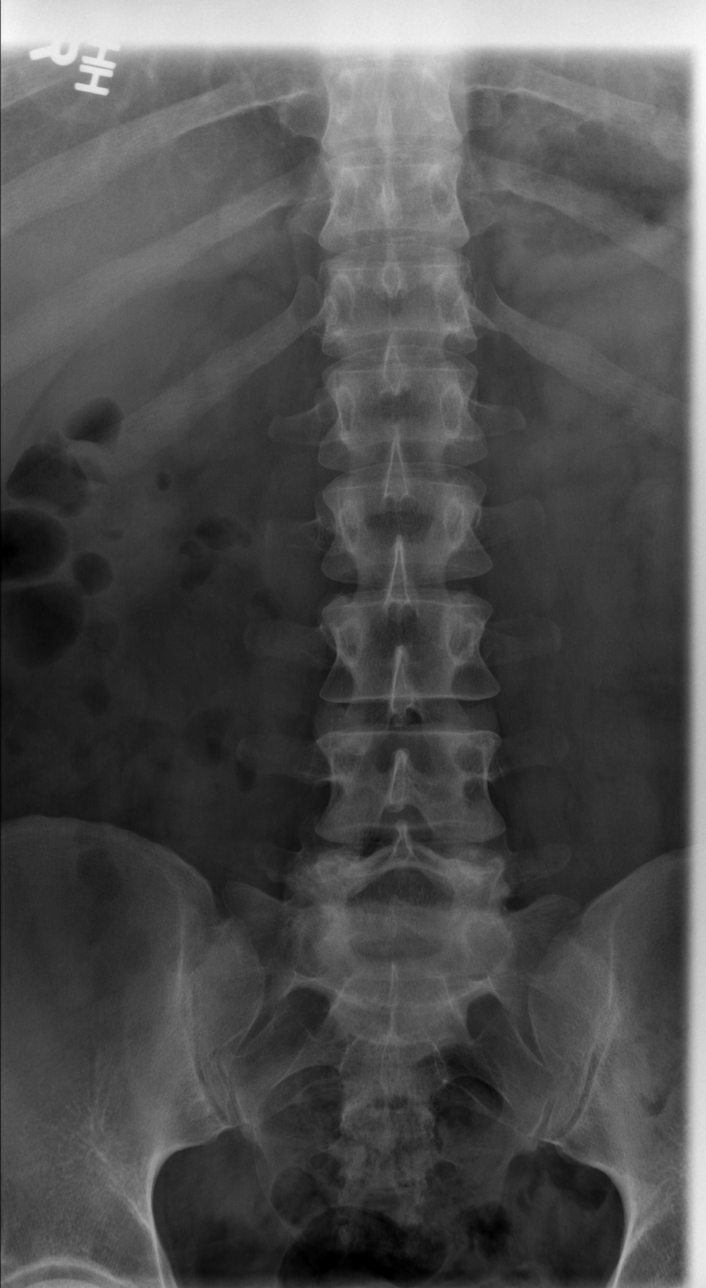

[t lumbar spine obl (1 of 2)]
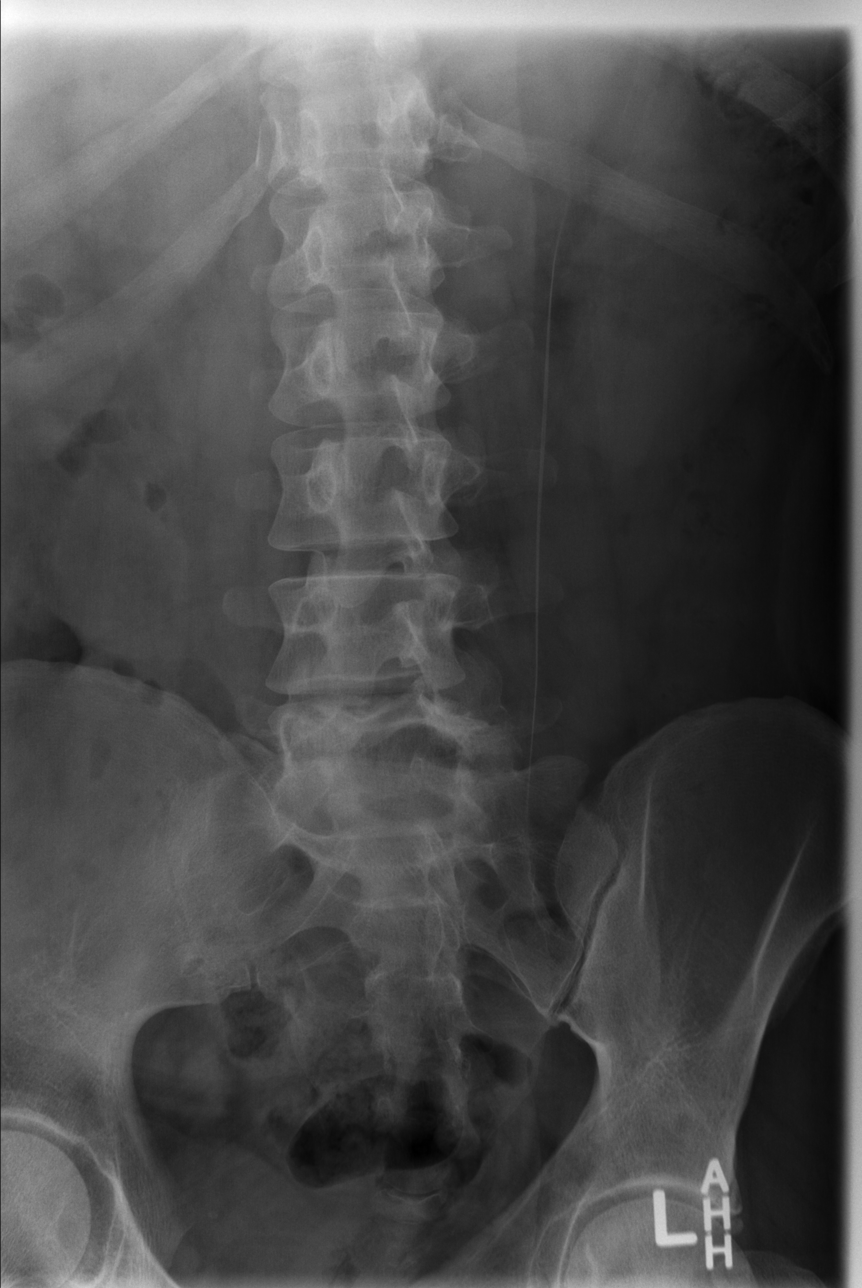

[t lumbar spine obl (2 of 2)]
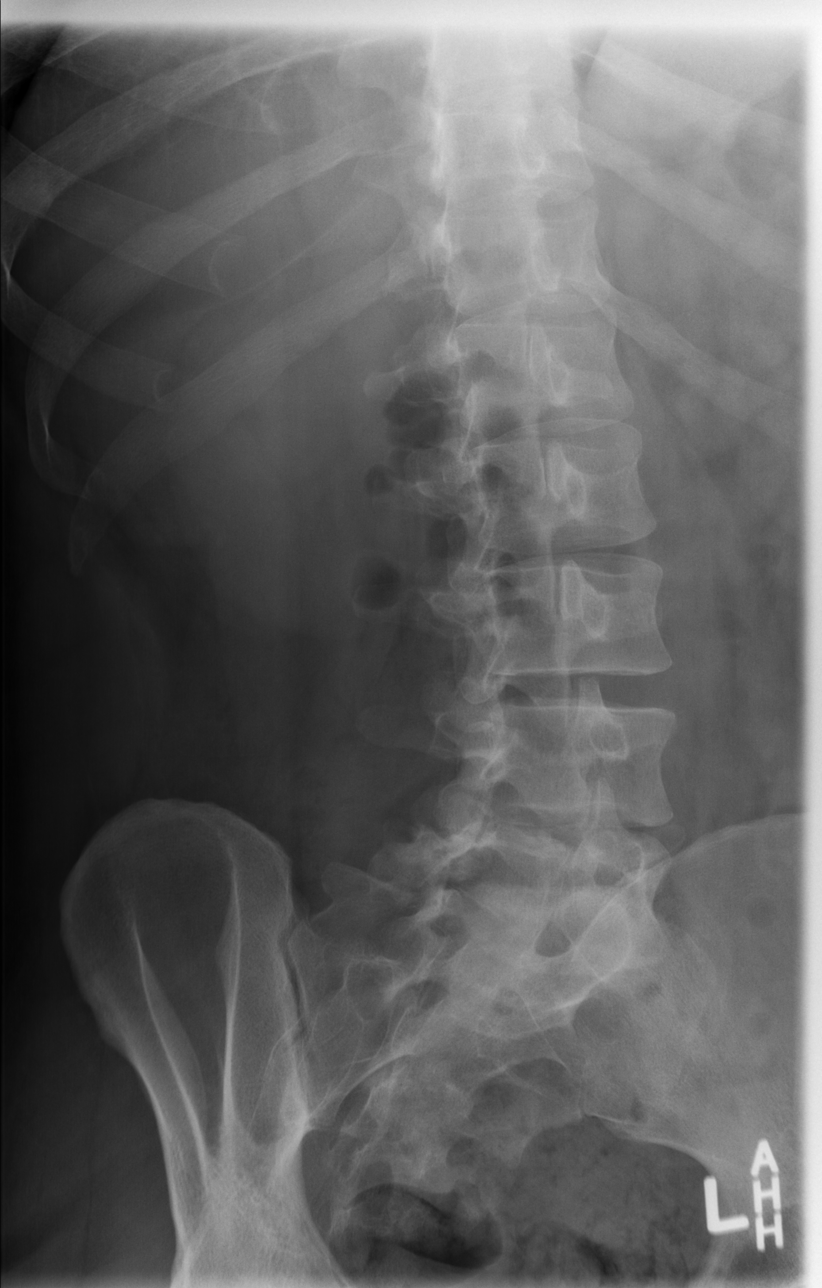

[t lumbar spine lat]
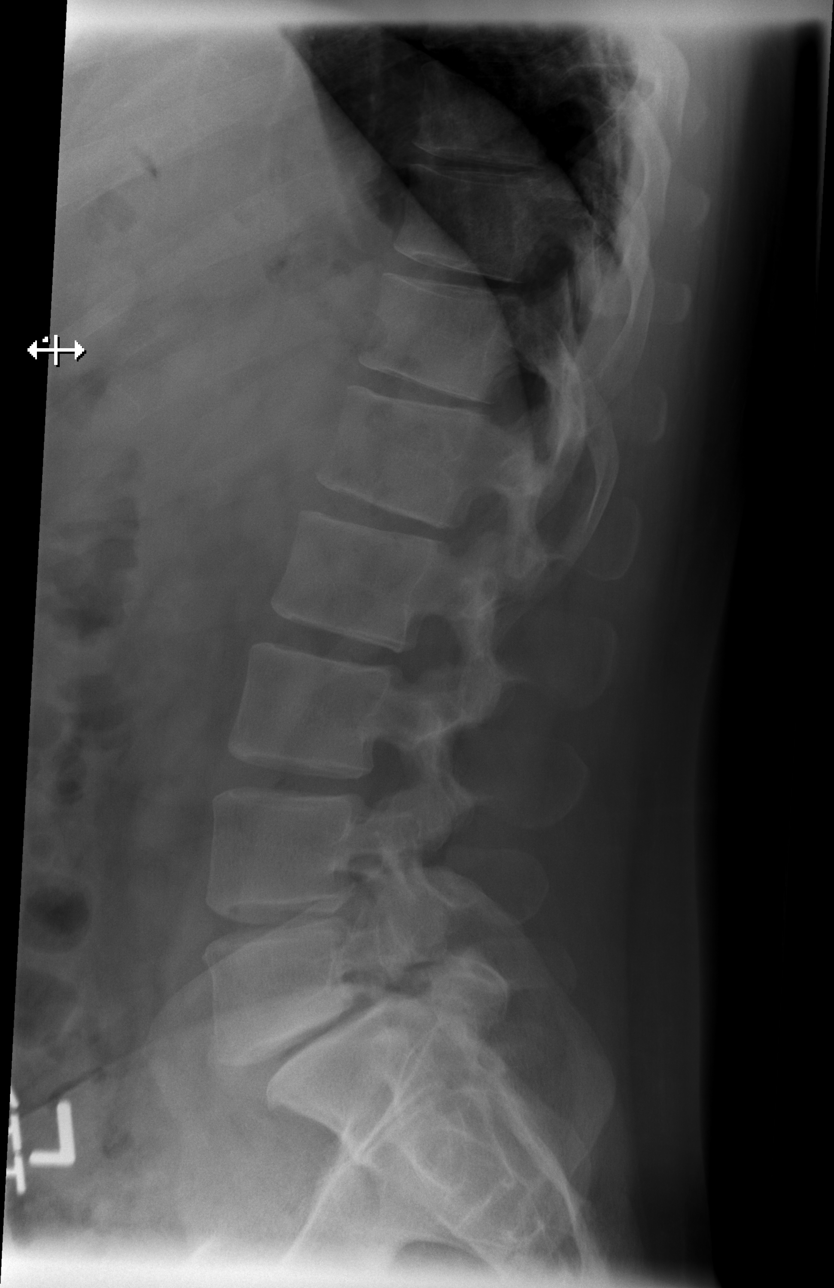

[t lumbar l-5 s-1 spot]
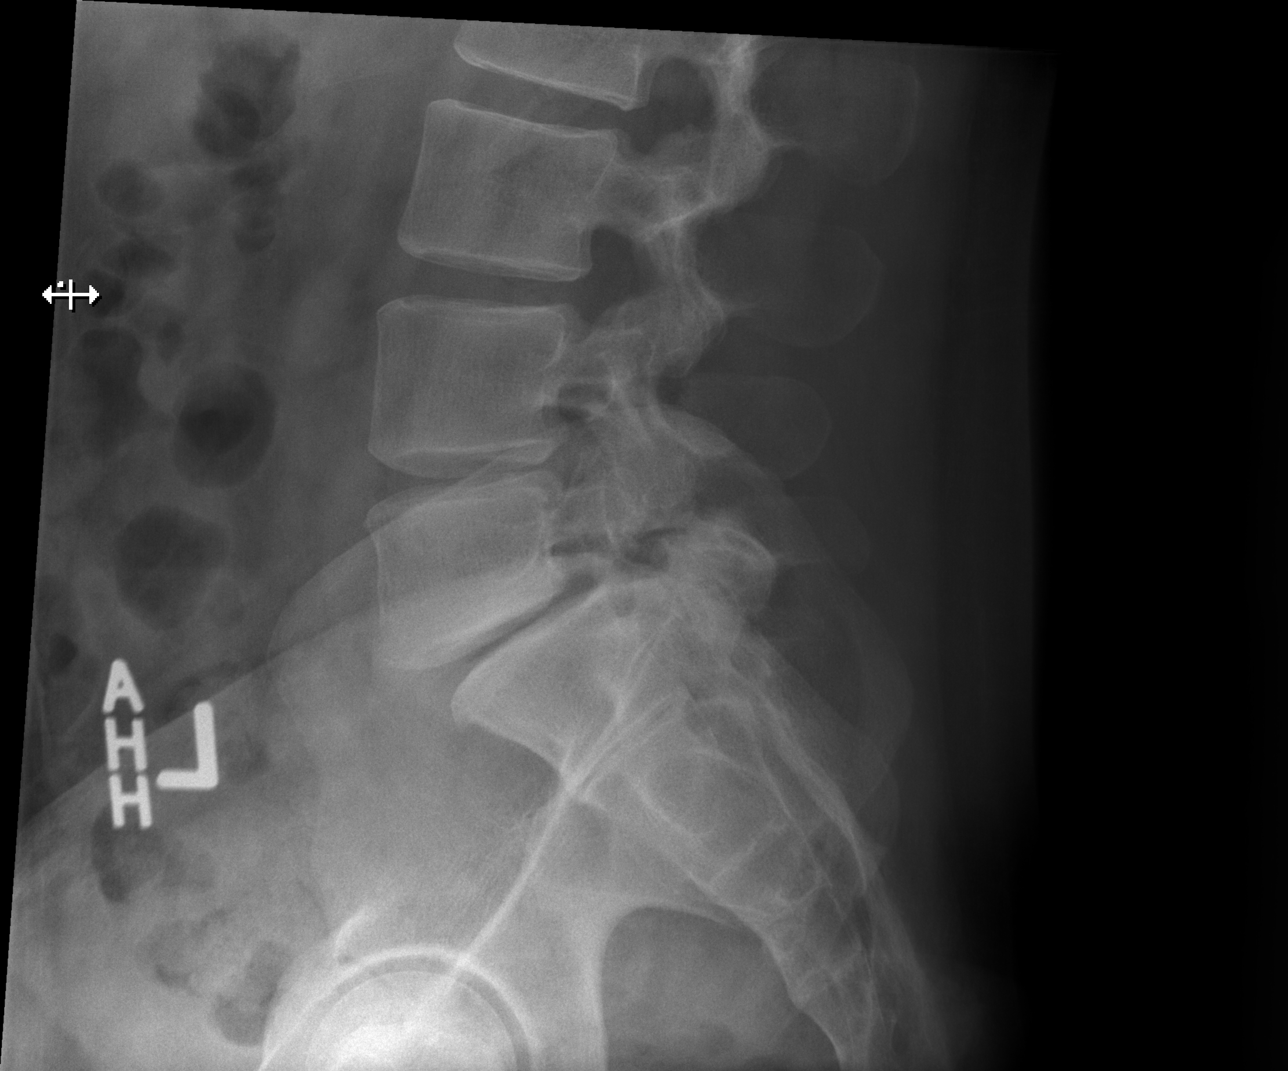

[5 of 5 positions shown; findings below may reference images not displayed]

FINDINGS: Five non rib-bearing lumbar type vertebra identified.

1 cm anterolisthesis of L5 on S1 is noted with bilateral L5 pars
defects.

Moderate to severe degenerative disc disease at L5-S1 noted.

There is no evidence of acute fracture.
IMPRESSION: Grade 1 anterolisthesis of L5 on S1 with bilateral L5 pars defects.

No evidence of acute fracture.
